# Patient Record
Sex: Male | Born: 1965 | Race: Black or African American | Hispanic: No | Marital: Married | Smoking: Never smoker
Health system: Southern US, Community
[De-identification: ages and names within clinical notes are randomized; demographics above are authoritative.]

## PROBLEM LIST (undated history)

## (undated) DIAGNOSIS — E785 Hyperlipidemia, unspecified: Secondary | ICD-10-CM

## (undated) DIAGNOSIS — Z789 Other specified health status: Secondary | ICD-10-CM

## (undated) DIAGNOSIS — I10 Essential (primary) hypertension: Secondary | ICD-10-CM

## (undated) HISTORY — PX: KNEE SURGERY: SHX244

---

## 2006-01-17 ENCOUNTER — Emergency Department: Payer: Self-pay | Admitting: Emergency Medicine

## 2014-10-22 ENCOUNTER — Encounter: Payer: Self-pay | Admitting: Emergency Medicine

## 2014-10-22 DIAGNOSIS — Y998 Other external cause status: Secondary | ICD-10-CM | POA: Insufficient documentation

## 2014-10-22 DIAGNOSIS — S39012A Strain of muscle, fascia and tendon of lower back, initial encounter: Secondary | ICD-10-CM | POA: Diagnosis not present

## 2014-10-22 DIAGNOSIS — S161XXA Strain of muscle, fascia and tendon at neck level, initial encounter: Secondary | ICD-10-CM | POA: Diagnosis not present

## 2014-10-22 DIAGNOSIS — Y92410 Unspecified street and highway as the place of occurrence of the external cause: Secondary | ICD-10-CM | POA: Diagnosis not present

## 2014-10-22 DIAGNOSIS — Y9389 Activity, other specified: Secondary | ICD-10-CM | POA: Insufficient documentation

## 2014-10-22 DIAGNOSIS — S199XXA Unspecified injury of neck, initial encounter: Secondary | ICD-10-CM | POA: Diagnosis present
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
## 2014-10-23 ENCOUNTER — Emergency Department
Admission: EM | Admit: 2014-10-23 | Discharge: 2014-10-23 | Disposition: A | Discharge status: Home / Self Care | Payer: 59 | Attending: Emergency Medicine | Admitting: Emergency Medicine

## 2014-10-23 ENCOUNTER — Emergency Department: Payer: 59

## 2014-10-23 DIAGNOSIS — S39012A Strain of muscle, fascia and tendon of lower back, initial encounter: Secondary | ICD-10-CM

## 2014-10-23 DIAGNOSIS — S161XXA Strain of muscle, fascia and tendon at neck level, initial encounter: Secondary | ICD-10-CM

## 2014-10-23 MED ORDER — IBUPROFEN 800 MG PO TABS
800.0000 mg | ORAL_TABLET | Freq: Once | ORAL | Status: AC
  Administered 2014-10-23: 800 mg via ORAL
  Filled 2014-10-23: qty 1

## 2014-10-23 MED ORDER — CYCLOBENZAPRINE HCL 10 MG PO TABS
5.0000 mg | ORAL_TABLET | Freq: Once | ORAL | Status: AC
  Administered 2014-10-23: 5 mg via ORAL
  Filled 2014-10-23: qty 1

## 2014-10-23 MED ORDER — IBUPROFEN 800 MG PO TABS: 800.0000 mg | ORAL_TABLET | Freq: Three times a day (TID) | ORAL | Status: DC | PRN

## 2014-10-23 MED ORDER — CYCLOBENZAPRINE HCL 5 MG PO TABS: 5.0000 mg | ORAL_TABLET | Freq: Three times a day (TID) | ORAL | Status: DC | PRN
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
## 2014-10-23 NOTE — Discharge Instructions (Signed)
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 minimize stress and discomfort on your spine, you must sit with correct posture. Sitting with good posture should be effortless for a healthy body. Returning to good posture is a gradual process. Many people can  work toward this most comfortably by using various supports until they have the flexibility and strength to maintain this posture on their own. When sitting with proper posture, your ears will fall over your shoulders and your shoulders will fall over your hips. You should use the back of the chair to support your upper back. Your lower back will be in a neutral position, just slightly arched. You may place a small pillow or folded towel at the base of your lower back for  support.  When working at a desk, create an environment that supports good, upright posture. Without extra support, muscles tire, which leads to excessive strain on joints and other tissues. Keep these recommendations in  mind: CHAIR:  A chair should be able to slide under your desk when your back makes contact with the back of the chair. This allows you to work closely.  The chair's height should allow your eyes to be level with the upper part of your monitor and your hands to be slightly lower than your elbows. BODY POSITION  Your feet should make contact with the floor. If this is not possible, use a foot rest.  Keep your ears over your shoulders. This will reduce stress on your neck and low back. INCORRECT SITTING POSTURES  If you are feeling tired and unable to assume a healthy sitting posture, do not slouch or slump. This puts excessive strain on your back tissues, causing more damage and pain. Healthier options include:  Using more support, like a lumbar pillow.  Switching tasks to something that requires you to be upright or walking.  Talking a brief walk.  Lying down to rest in a neutral-spine position. PROLONGED STANDING WHILE SLIGHTLY LEANING FORWARD  When completing a task that requires you to lean forward while standing in one place for a long time, place either foot up on a stationary 2-4 inch high object to help maintain the best posture. When both feet are on the ground, the lower back tends to lose its slight inward curve. If this curve flattens (or becomes too large), then the back and your other joints will experience too much stress, tire more quickly, and can cause pain. CORRECT STANDING POSTURES Proper standing posture should be assumed with all daily activities, even if they only take a few moments, like when brushing your teeth. As in sitting, your ears should fall over your shoulders and your shoulders should fall over your hips. You should keep a slight tension in your abdominal muscles to brace your spine. Your tailbone should point down to the ground, not behind your body, resulting in an over-extended swayback posture.  INCORRECT STANDING POSTURES  Common incorrect standing  postures include a forward head, locked knees and/or an excessive swayback. WALKING Walk with an upright posture. Your ears, shoulders and hips should all line-up. PROLONGED ACTIVITY IN A FLEXED POSITION When completing a task that requires you to bend forward at your waist or lean over a low surface, try to find a way to stabilize 3 out of 4 of your limbs. You can place a hand or elbow on your thigh or rest a knee on the surface you are reaching across. This will provide you more stability, so that your muscles do not tire as quickly. By keeping your knees relaxed, or slightly bent, you will also reduce stress across your lower back. CORRECT LIFTING TECHNIQUES DO :  Assume a wide stance. This will provide you more stability and the opportunity to get as  close as possible to the object which you are lifting.  Tense your abdominals to brace your spine. Bend at the knees and hips. Keeping your back locked in a neutral-spine position, lift using your leg muscles. Lift with your legs, keeping your back straight.  Test the weight of unknown objects before attempting to lift them.  Try to keep your elbows locked down at your sides in order get the best strength from your shoulders when carrying an object.  Always ask for help when lifting heavy or awkward objects. INCORRECT LIFTING TECHNIQUES DO NOT:   Lock your knees when lifting, even if it is a small object.  Bend and twist. Pivot at your feet or move your feet when needing to change directions.  Assume that you can safely pick up even a paperclip without proper posture.   This information is not intended to replace advice given to you by your health care provider. Make sure you discuss any questions you have with your health care provider.   Document Released: 12/27/2004 Document Revised: 01/17/2014 Document Reviewed: 04/10/2008 Elsevier Interactive Patient Education [employer]
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
## 2014-10-23 NOTE — ED Notes (Signed)
Patient transported to X-ray 

## 2014-10-23 NOTE — ED Notes (Signed)

## 2014-11-29 ENCOUNTER — Encounter: Payer: Self-pay | Admitting: *Deleted

## 2014-11-29 ENCOUNTER — Emergency Department
Admission: EM | Admit: 2014-11-29 | Discharge: 2014-11-29 | Disposition: A | Discharge status: Home / Self Care | Payer: No Typology Code available for payment source | Attending: Emergency Medicine | Admitting: Emergency Medicine

## 2014-11-29 DIAGNOSIS — M7541 Impingement syndrome of right shoulder: Secondary | ICD-10-CM | POA: Insufficient documentation

## 2014-11-29 DIAGNOSIS — M25511 Pain in right shoulder: Secondary | ICD-10-CM | POA: Diagnosis present

## 2014-11-29 MED ORDER — MELOXICAM 15 MG PO TABS: 15.0000 mg | ORAL_TABLET | Freq: Every day | ORAL | Status: DC
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
## 2014-11-29 NOTE — Discharge Instructions (Signed)
Impingement Syndrome, Rotator Cuff, Bursitis With Rehab °Impingement syndrome is a condition that involves inflammation of the tendons of the rotator cuff and the subacromial bursa, that causes pain in the shoulder. The rotator cuff consists of four tendons and muscles that control much of the shoulder and upper arm function. The subacromial bursa is a fluid filled sac that helps reduce friction between the rotator cuff and one of the bones of the shoulder (acromion). Impingement syndrome is usually an overuse injury that causes swelling of the bursa (bursitis), swelling of the tendon (tendonitis), and/or a tear of the tendon (strain). Strains are classified into three categories. Grade 1 strains cause pain, but the tendon is not lengthened. Grade 2 strains include a lengthened ligament, due to the ligament being stretched or partially ruptured. With grade 2 strains there is still function, although the function may be decreased. Grade 3 strains include a complete tear of the tendon or muscle, and function is usually impaired. °SYMPTOMS  °· Pain around the shoulder, often at the outer portion of the upper arm. °· Pain that gets worse with shoulder function, especially when reaching overhead or lifting. °· Sometimes, aching when not using the arm. °· Pain that wakes you up at night. °· Sometimes, tenderness, swelling, warmth, or redness over the affected area. °· Loss of strength. °· Limited motion of the shoulder, especially reaching behind the back (to the back pocket or to unhook bra) or across your body. °· Crackling sound (crepitation) when moving the arm. °· Biceps tendon pain and inflammation (in the front of the shoulder). Worse when bending the elbow or lifting. °CAUSES  °Impingement syndrome is often an overuse injury, in which chronic (repetitive) motions cause the tendons or bursa to become inflamed. A strain occurs when a force is paced on the tendon or muscle that is greater than it can withstand.  Common mechanisms of injury include: °Stress from sudden increase in duration, frequency, or intensity of training. °· Direct hit (trauma) to the shoulder. °· Aging, erosion of the tendon with normal use. °· Bony bump on shoulder (acromial spur). °RISK INCREASES WITH: °· Contact sports (football, wrestling, boxing). °· Throwing sports (baseball, tennis, volleyball). °· Weightlifting and bodybuilding. °· Heavy labor. °· Previous injury to the rotator cuff, including impingement. °· Poor shoulder strength and flexibility. °· Failure to warm up properly before activity. °· Inadequate protective equipment. °· Old age. °· Bony bump on shoulder (acromial spur). °PREVENTION  °· Warm up and stretch properly before activity. °· Allow for adequate recovery between workouts. °· Maintain physical fitness: °¨ Strength, flexibility, and endurance. °¨ Cardiovascular fitness. °· Learn and use proper exercise technique. °PROGNOSIS  °If treated properly, impingement syndrome usually goes away within 6 weeks. Sometimes surgery is required.  °RELATED COMPLICATIONS  °· Longer healing time if not properly treated, or if not given enough time to heal. °· Recurring symptoms, that result in a chronic condition. °· Shoulder stiffness, frozen shoulder, or loss of motion. °· Rotator cuff tendon tear. °· Recurring symptoms, especially if activity is resumed too soon, with overuse, with a direct blow, or when using poor technique. °TREATMENT  °Treatment first involves the use of ice and medicine, to reduce pain and inflammation. The use of strengthening and stretching exercises may help reduce pain with activity. These exercises may be performed at home or with a therapist. If non-surgical treatment is unsuccessful after more than 6 months, surgery may be advised. After surgery and rehabilitation, activity is usually possible in 3 months.  °  MEDICATION °· If pain medicine is needed, nonsteroidal anti-inflammatory medicines (aspirin and  ibuprofen), or other minor pain relievers (acetaminophen), are often advised. °· Do not take pain medicine for 7 days before surgery. °· Prescription pain relievers may be given, if your caregiver thinks they are needed. Use only as directed and only as much as you need. °· Corticosteroid injections may be given by your caregiver. These injections should be reserved for the most serious cases, because they may only be given a certain number of times. °HEAT AND COLD °· Cold treatment (icing) should be applied for 10 to 15 minutes every 2 to 3 hours for inflammation and pain, and immediately after activity that aggravates your symptoms. Use ice packs or an ice massage. °· Heat treatment may be used before performing stretching and strengthening activities prescribed by your caregiver, physical therapist, or athletic trainer. Use a heat pack or a warm water soak. °SEEK MEDICAL CARE IF:  °· Symptoms get worse or do not improve in 4 to 6 weeks, despite treatment. °· New, unexplained symptoms develop. (Drugs used in treatment may produce side effects.) ° °
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
## 2014-12-17 ENCOUNTER — Other Ambulatory Visit: Payer: Self-pay | Admitting: Orthopedic Surgery

## 2014-12-17 DIAGNOSIS — M25511 Pain in right shoulder: Secondary | ICD-10-CM

## 2015-01-08 ENCOUNTER — Ambulatory Visit
Admission: RE | Admit: 2015-01-08 | Discharge: 2015-01-08 | Disposition: A | Discharge status: Home / Self Care | Payer: 59 | Source: Ambulatory Visit | Attending: Orthopedic Surgery | Admitting: Orthopedic Surgery

## 2015-01-08 DIAGNOSIS — M7551 Bursitis of right shoulder: Secondary | ICD-10-CM | POA: Insufficient documentation

## 2015-01-08 DIAGNOSIS — S46021A Laceration of muscle(s) and tendon(s) of the rotator cuff of right shoulder, initial encounter: Secondary | ICD-10-CM | POA: Insufficient documentation

## 2015-01-08 DIAGNOSIS — M25511 Pain in right shoulder: Secondary | ICD-10-CM | POA: Diagnosis present

## 2015-01-08 MED ORDER — GADOBENATE DIMEGLUMINE 529 MG/ML IV SOLN
0.1000 mL | Freq: Once | INTRAVENOUS | Status: AC | PRN
  Administered 2015-01-08: 0.1 mL via INTRA_ARTICULAR

## 2015-01-08 MED ORDER — IOHEXOL 300 MG/ML  SOLN
10.0000 mL | Freq: Once | INTRAMUSCULAR | Status: AC | PRN
  Administered 2015-01-08: 10 mL via INTRA_ARTICULAR

## 2015-07-31 ENCOUNTER — Emergency Department: Payer: Worker's Compensation

## 2015-07-31 ENCOUNTER — Emergency Department
Admission: EM | Admit: 2015-07-31 | Discharge: 2015-07-31 | Disposition: A | Discharge status: Home / Self Care | Payer: Worker's Compensation | Attending: Emergency Medicine | Admitting: Emergency Medicine

## 2015-07-31 ENCOUNTER — Encounter: Payer: Self-pay | Admitting: Medical Oncology

## 2015-07-31 DIAGNOSIS — Y999 Unspecified external cause status: Secondary | ICD-10-CM | POA: Diagnosis not present

## 2015-07-31 DIAGNOSIS — R51 Headache: Secondary | ICD-10-CM | POA: Diagnosis not present

## 2015-07-31 DIAGNOSIS — S40012A Contusion of left shoulder, initial encounter: Secondary | ICD-10-CM | POA: Diagnosis not present

## 2015-07-31 DIAGNOSIS — Y9389 Activity, other specified: Secondary | ICD-10-CM | POA: Diagnosis not present

## 2015-07-31 DIAGNOSIS — M542 Cervicalgia: Secondary | ICD-10-CM | POA: Diagnosis present

## 2015-07-31 DIAGNOSIS — Y92410 Unspecified street and highway as the place of occurrence of the external cause: Secondary | ICD-10-CM | POA: Insufficient documentation

## 2015-07-31 DIAGNOSIS — M62838 Other muscle spasm: Secondary | ICD-10-CM | POA: Diagnosis not present

## 2015-07-31 MED ORDER — MELOXICAM 15 MG PO TABS: 15.0000 mg | ORAL_TABLET | Freq: Every day | ORAL | Status: DC

## 2015-07-31 MED ORDER — METHOCARBAMOL 500 MG PO TABS: 500.0000 mg | ORAL_TABLET | Freq: Four times a day (QID) | ORAL | Status: DC

## 2015-07-31 NOTE — ED Notes (Signed)
  Reviewed d/c instructions, follow-up care, and prescriptions with pt. Pt verbalized understanding 
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
## 2015-10-01 ENCOUNTER — Other Ambulatory Visit: Payer: Self-pay

## 2015-10-27 ENCOUNTER — Other Ambulatory Visit: Payer: Self-pay

## 2015-11-03 ENCOUNTER — Ambulatory Visit: Admit: 2015-11-03 | Payer: No Typology Code available for payment source | Admitting: Orthopedic Surgery

## 2015-11-03 SURGERY — ARTHROSCOPY, SHOULDER WITH REPAIR, ROTATOR CUFF, OPEN
Anesthesia: Choice | Laterality: Right

## 2016-10-28 ENCOUNTER — Other Ambulatory Visit: Payer: Self-pay | Admitting: Orthopedic Surgery

## 2016-11-01 ENCOUNTER — Encounter
Admission: RE | Admit: 2016-11-01 | Discharge: 2016-11-01 | Disposition: A | Discharge status: Home / Self Care | Payer: No Typology Code available for payment source | Source: Ambulatory Visit | Attending: Orthopedic Surgery | Admitting: Orthopedic Surgery

## 2016-11-01 HISTORY — DX: Other specified health status: Z78.9

## 2016-11-01 NOTE — Patient Instructions (Signed)
Your procedure is scheduled on: 11-08-16 TUESDAY Report to Same Day Surgery 2nd floor medical mall [facility](Medical Mall Entrance-take elevator on left to 2nd floor.  Check in with surgery information desk.) To find out your arrival time please call [phone] between 1PM - 3PM on 11-07-16 MONDAY  Remember: Instructions that are not followed completely may result in serious medical risk, up to and including death, or upon the discretion of your surgeon and anesthesiologist your surgery may need to be rescheduled.    _x___ 1. Do not eat food after midnight the night before your procedure. NO GUM CHEWING OR HARD CANDIES.  You may drink clear liquids up to 2 hours before you are scheduled to arrive at the hospital for your procedure.  Do not drink clear liquids within 2 hours of your scheduled arrival to the hospital.  Clear liquids include  --Water or Apple juice without pulp  --Clear carbohydrate beverage such as ClearFast or Gatorade  --Black Coffee or Clear Tea (No milk, no creamers, do not add anything to the coffee or Tea)  Type 1 and type 2 diabetics should only drink water.     __x__ 2. No Alcohol for 24 hours before or after surgery.   __x__3. No Smoking for 24 prior to surgery.   ____  4. Bring all medications with you on the day of surgery if instructed.    __x__ 5. Notify your doctor if there is any change in your medical condition     (cold, fever, infections).     Do not wear jewelry, make-up, hairpins, clips or nail polish.  Do not wear lotions, powders, or perfumes. You may wear deodorant.  Do not shave 48 hours prior to surgery. Men may shave face and neck.  Do not bring valuables to the hospital.    [facility] not responsible for any belongings or valuables.               Contacts, dentures or bridgework may not be worn into surgery.  Leave your suitcase in the car. After surgery it may be brought to your room.  For patients admitted to the hospital, discharge time  is determined by your                       treatment team.   Patients discharged the day of surgery will not be allowed to drive home.  You will need someone to drive you home and stay with you the night of your procedure.    Please read over the following fact sheets that you were given:   [facility] Preparing for [facility] and or MRSA Information   ____ Take anti-hypertensive listed below, cardiac, seizure, asthma, anti-reflux and psychiatric medicines. These include:  1. NONE  2.  3.  4.  5.  6.  ____Fleets enema or Magnesium Citrate as directed.   _x___ Use CHG Soap or sage wipes as directed on instruction sheet   ____ Use inhalers on the day of surgery and bring to hospital day of surgery  ____ Stop Metformin and Janumet 2 days prior to surgery.    ____ Take 1/2 of usual insulin dose the night before surgery and none on the morning surgery.   ____ Follow recommendations from Cardiologist, Pulmonologist or PCP regarding stopping Aspirin, Coumadin, Plavix ,Eliquis, Effient, or Pradaxa, and Pletal.  X____Stop Anti-inflammatories such as Advil, Aleve, IBUPROFEN, Motrin, Naproxen, MELOXICAM, Naprosyn, Goodies powders or aspirin products NOW-OK to take Tylenol  ____ Stop supplements until after surgery.     ____ Bring C-Pap to the hospital.

## 2016-11-02 ENCOUNTER — Inpatient Hospital Stay: Admission: RE | Admit: 2016-11-02 | Payer: Self-pay | Source: Ambulatory Visit

## 2016-11-03 ENCOUNTER — Encounter
Admission: RE | Admit: 2016-11-03 | Discharge: 2016-11-03 | Disposition: A | Discharge status: Home / Self Care | Payer: 59 | Source: Ambulatory Visit | Attending: Orthopedic Surgery | Admitting: Orthopedic Surgery

## 2016-11-03 DIAGNOSIS — M75112 Incomplete rotator cuff tear or rupture of left shoulder, not specified as traumatic: Secondary | ICD-10-CM | POA: Insufficient documentation

## 2016-11-03 DIAGNOSIS — M7552 Bursitis of left shoulder: Secondary | ICD-10-CM | POA: Insufficient documentation

## 2016-11-03 DIAGNOSIS — Z882 Allergy status to sulfonamides status: Secondary | ICD-10-CM | POA: Insufficient documentation

## 2016-11-03 DIAGNOSIS — M19012 Primary osteoarthritis, left shoulder: Secondary | ICD-10-CM | POA: Insufficient documentation

## 2016-11-03 DIAGNOSIS — M7542 Impingement syndrome of left shoulder: Secondary | ICD-10-CM | POA: Insufficient documentation

## 2016-11-03 DIAGNOSIS — Z9889 Other specified postprocedural states: Secondary | ICD-10-CM | POA: Insufficient documentation

## 2016-11-03 DIAGNOSIS — Z01812 Encounter for preprocedural laboratory examination: Secondary | ICD-10-CM | POA: Insufficient documentation

## 2016-11-03 DIAGNOSIS — Z8249 Family history of ischemic heart disease and other diseases of the circulatory system: Secondary | ICD-10-CM | POA: Insufficient documentation

## 2016-11-03 DIAGNOSIS — Z881 Allergy status to other antibiotic agents status: Secondary | ICD-10-CM | POA: Insufficient documentation

## 2016-11-03 LAB — CBC WITH DIFFERENTIAL/PLATELET
BASOS ABS: 0 10*3/uL (ref 0–0.1)
BASOS PCT: 0 %
Eosinophils Absolute: 0.1 10*3/uL (ref 0–0.7)
Eosinophils Relative: 2 %
HEMATOCRIT: 40.6 % (ref 40.0–52.0)
HEMOGLOBIN: 13.2 g/dL (ref 13.0–18.0)
Lymphocytes Relative: 42 %
Lymphs Abs: 2.2 10*3/uL (ref 1.0–3.6)
MCH: 28.4 pg (ref 26.0–34.0)
MCHC: 32.5 g/dL (ref 32.0–36.0)
MCV: 87.3 fL (ref 80.0–100.0)
MONOS PCT: 8 %
Monocytes Absolute: 0.4 10*3/uL (ref 0.2–1.0)
NEUTROS ABS: 2.5 10*3/uL (ref 1.4–6.5)
NEUTROS PCT: 48 %
Platelets: 305 10*3/uL (ref 150–440)
RBC: 4.65 MIL/uL (ref 4.40–5.90)
RDW: 14.2 % (ref 11.5–14.5)
WBC: 5.3 10*3/uL (ref 3.8–10.6)

## 2016-11-03 LAB — APTT: aPTT: 30 seconds (ref 24–36)

## 2016-11-03 LAB — PROTIME-INR
INR: 0.98
PROTHROMBIN TIME: 12.9 s (ref 11.4–15.2)

## 2016-11-07 MED ORDER — CEFAZOLIN SODIUM-DEXTROSE 2-4 GM/100ML-% IV SOLN
2.0000 g | INTRAVENOUS | Status: AC
  Administered 2016-11-08: 2 g via INTRAVENOUS

## 2016-11-08 ENCOUNTER — Encounter: Payer: Self-pay | Admitting: *Deleted

## 2016-11-08 ENCOUNTER — Ambulatory Visit: Payer: Worker's Compensation | Admitting: Anesthesiology

## 2016-11-08 ENCOUNTER — Encounter
Admission: RE | Disposition: A | Discharge status: Home / Self Care | Payer: Self-pay | Source: Ambulatory Visit | Attending: Orthopedic Surgery

## 2016-11-08 ENCOUNTER — Ambulatory Visit
Admission: RE | Admit: 2016-11-08 | Discharge: 2016-11-08 | Disposition: A | Discharge status: Home / Self Care | Payer: Worker's Compensation | Source: Ambulatory Visit | Attending: Orthopedic Surgery | Admitting: Orthopedic Surgery

## 2016-11-08 DIAGNOSIS — Z882 Allergy status to sulfonamides status: Secondary | ICD-10-CM | POA: Insufficient documentation

## 2016-11-08 DIAGNOSIS — M7542 Impingement syndrome of left shoulder: Secondary | ICD-10-CM | POA: Diagnosis not present

## 2016-11-08 DIAGNOSIS — M7502 Adhesive capsulitis of left shoulder: Secondary | ICD-10-CM | POA: Insufficient documentation

## 2016-11-08 DIAGNOSIS — M75112 Incomplete rotator cuff tear or rupture of left shoulder, not specified as traumatic: Secondary | ICD-10-CM | POA: Diagnosis not present

## 2016-11-08 DIAGNOSIS — M25819 Other specified joint disorders, unspecified shoulder: Secondary | ICD-10-CM | POA: Diagnosis present

## 2016-11-08 HISTORY — PX: SHOULDER ARTHROSCOPY WITH OPEN ROTATOR CUFF REPAIR AND DISTAL CLAVICLE ACROMINECTOMY: SHX5683

## 2016-11-08 SURGERY — SHOULDER ARTHROSCOPY WITH OPEN ROTATOR CUFF REPAIR AND DISTAL CLAVICLE ACROMINECTOMY
Anesthesia: Regional | Site: Shoulder | Laterality: Left | Wound class: Clean

## 2016-11-08 MED ORDER — CEFAZOLIN SODIUM-DEXTROSE 2-4 GM/100ML-% IV SOLN
INTRAVENOUS | Status: AC
  Filled 2016-11-08: qty 100

## 2016-11-08 MED ORDER — FENTANYL CITRATE (PF) 100 MCG/2ML IJ SOLN
INTRAMUSCULAR | Status: AC
  Filled 2016-11-08: qty 2

## 2016-11-08 MED ORDER — DEXAMETHASONE SODIUM PHOSPHATE 10 MG/ML IJ SOLN
INTRAMUSCULAR | Status: AC
  Filled 2016-11-08: qty 1

## 2016-11-08 MED ORDER — PROPOFOL 10 MG/ML IV BOLUS
INTRAVENOUS | Status: DC | PRN
  Administered 2016-11-08: 200 mg via INTRAVENOUS

## 2016-11-08 MED ORDER — MIDAZOLAM HCL 2 MG/2ML IJ SOLN
INTRAMUSCULAR | Status: AC
  Administered 2016-11-08: 1 mg via INTRAVENOUS
  Filled 2016-11-08: qty 2

## 2016-11-08 MED ORDER — GLYCOPYRROLATE 0.2 MG/ML IJ SOLN
INTRAMUSCULAR | Status: AC
Start: 2016-11-08 — End: 2016-11-08
  Filled 2016-11-08: qty 2

## 2016-11-08 MED ORDER — LIDOCAINE HCL (PF) 1 % IJ SOLN
INTRAMUSCULAR | Status: DC | PRN
  Administered 2016-11-08: 5 mL via SUBCUTANEOUS

## 2016-11-08 MED ORDER — FENTANYL CITRATE (PF) 100 MCG/2ML IJ SOLN
INTRAMUSCULAR | Status: AC
  Administered 2016-11-08: 50 ug via INTRAVENOUS
  Filled 2016-11-08: qty 2

## 2016-11-08 MED ORDER — ONDANSETRON HCL 4 MG/2ML IJ SOLN
INTRAMUSCULAR | Status: DC | PRN
  Administered 2016-11-08: 4 mg via INTRAVENOUS

## 2016-11-08 MED ORDER — ROCURONIUM BROMIDE 100 MG/10ML IV SOLN
INTRAVENOUS | Status: DC | PRN
  Administered 2016-11-08: 40 mg via INTRAVENOUS

## 2016-11-08 MED ORDER — PROMETHAZINE HCL 25 MG/ML IJ SOLN
INTRAMUSCULAR | Status: AC
  Filled 2016-11-08: qty 1

## 2016-11-08 MED ORDER — BUPIVACAINE HCL 0.25 % IJ SOLN
INTRAMUSCULAR | Status: DC | PRN
  Administered 2016-11-08: 30 mL

## 2016-11-08 MED ORDER — LIDOCAINE HCL (CARDIAC) 20 MG/ML IV SOLN
INTRAVENOUS | Status: DC | PRN
Start: 2016-11-08 — End: 2016-11-08
  Administered 2016-11-08: 100 mg via INTRAVENOUS

## 2016-11-08 MED ORDER — FENTANYL CITRATE (PF) 100 MCG/2ML IJ SOLN: 25.0000 ug | INTRAMUSCULAR | Status: DC | PRN

## 2016-11-08 MED ORDER — NEOMYCIN-POLYMYXIN B GU 40-200000 IR SOLN
Status: DC | PRN
  Administered 2016-11-08: 2 mL

## 2016-11-08 MED ORDER — FAMOTIDINE 20 MG PO TABS
ORAL_TABLET | ORAL | Status: AC
  Administered 2016-11-08: 20 mg via ORAL
  Filled 2016-11-08: qty 1

## 2016-11-08 MED ORDER — PROPOFOL 10 MG/ML IV BOLUS
INTRAVENOUS | Status: AC
  Filled 2016-11-08: qty 40

## 2016-11-08 MED ORDER — SUCCINYLCHOLINE CHLORIDE 20 MG/ML IJ SOLN
INTRAMUSCULAR | Status: AC
  Filled 2016-11-08: qty 1

## 2016-11-08 MED ORDER — ONDANSETRON HCL 4 MG/2ML IJ SOLN
INTRAMUSCULAR | Status: AC
  Filled 2016-11-08: qty 4

## 2016-11-08 MED ORDER — SODIUM CHLORIDE 0.9 % IJ SOLN
INTRAMUSCULAR | Status: AC
  Filled 2016-11-08: qty 10

## 2016-11-08 MED ORDER — CHLORHEXIDINE GLUCONATE CLOTH 2 % EX PADS: 6.0000 | MEDICATED_PAD | Freq: Once | CUTANEOUS | Status: DC

## 2016-11-08 MED ORDER — LIDOCAINE HCL (PF) 1 % IJ SOLN
INTRAMUSCULAR | Status: AC
  Filled 2016-11-08: qty 5

## 2016-11-08 MED ORDER — EPINEPHRINE 30 MG/30ML IJ SOLN
INTRAMUSCULAR | Status: AC
  Filled 2016-11-08: qty 1

## 2016-11-08 MED ORDER — PROPOFOL 10 MG/ML IV BOLUS
INTRAVENOUS | Status: AC
  Filled 2016-11-08: qty 20

## 2016-11-08 MED ORDER — MIDAZOLAM HCL 2 MG/2ML IJ SOLN
INTRAMUSCULAR | Status: AC
  Filled 2016-11-08: qty 2

## 2016-11-08 MED ORDER — OXYCODONE HCL 5 MG PO TABS: 5.0000 mg | ORAL_TABLET | Freq: Four times a day (QID) | ORAL | 0 refills | Status: DC | PRN

## 2016-11-08 MED ORDER — ROPIVACAINE HCL 5 MG/ML IJ SOLN
INTRAMUSCULAR | Status: DC | PRN
  Administered 2016-11-08: 30 mL via PERINEURAL

## 2016-11-08 MED ORDER — MIDAZOLAM HCL 2 MG/2ML IJ SOLN
1.0000 mg | Freq: Once | INTRAMUSCULAR | Status: AC
  Administered 2016-11-08: 1 mg via INTRAVENOUS

## 2016-11-08 MED ORDER — LACTATED RINGERS IV SOLN
INTRAVENOUS | Status: DC
  Administered 2016-11-08: 07:00:00 via INTRAVENOUS

## 2016-11-08 MED ORDER — PROMETHAZINE HCL 25 MG/ML IJ SOLN
6.2500 mg | INTRAMUSCULAR | Status: DC | PRN
  Administered 2016-11-08: 6.25 mg via INTRAVENOUS

## 2016-11-08 MED ORDER — ONDANSETRON HCL 4 MG PO TABS: 4.0000 mg | ORAL_TABLET | Freq: Three times a day (TID) | ORAL | 0 refills | Status: DC | PRN

## 2016-11-08 MED ORDER — ROCURONIUM BROMIDE 50 MG/5ML IV SOLN
INTRAVENOUS | Status: AC
  Filled 2016-11-08: qty 1

## 2016-11-08 MED ORDER — FENTANYL CITRATE (PF) 100 MCG/2ML IJ SOLN
50.0000 ug | Freq: Once | INTRAMUSCULAR | Status: AC
  Administered 2016-11-08: 50 ug via INTRAVENOUS

## 2016-11-08 MED ORDER — ROPIVACAINE HCL 5 MG/ML IJ SOLN
INTRAMUSCULAR | Status: AC
  Filled 2016-11-08: qty 30

## 2016-11-08 MED ORDER — FAMOTIDINE 20 MG PO TABS
20.0000 mg | ORAL_TABLET | Freq: Once | ORAL | Status: AC
  Administered 2016-11-08: 20 mg via ORAL

## 2016-11-08 MED ORDER — EPINEPHRINE PF 1 MG/ML IJ SOLN
INTRAMUSCULAR | Status: DC | PRN
  Administered 2016-11-08: 10 mL

## 2016-11-08 MED ORDER — GLYCOPYRROLATE 0.2 MG/ML IJ SOLN
INTRAMUSCULAR | Status: DC | PRN
  Administered 2016-11-08: 0.6 mg via INTRAVENOUS

## 2016-11-08 MED ORDER — NEOSTIGMINE METHYLSULFATE 10 MG/10ML IV SOLN
INTRAVENOUS | Status: DC | PRN
  Administered 2016-11-08: 5 mg via INTRAVENOUS

## 2016-11-08 MED ORDER — DEXAMETHASONE SODIUM PHOSPHATE 10 MG/ML IJ SOLN
INTRAMUSCULAR | Status: DC | PRN
Start: 2016-11-08 — End: 2016-11-08
  Administered 2016-11-08: 10 mg via INTRAVENOUS

## 2016-11-08 MED ORDER — BUPIVACAINE HCL (PF) 0.25 % IJ SOLN
INTRAMUSCULAR | Status: AC
  Filled 2016-11-08: qty 30

## 2016-11-08 MED ORDER — NEOMYCIN-POLYMYXIN B GU 40-200000 IR SOLN
Status: AC
  Filled 2016-11-08: qty 2

## 2016-11-08 SURGICAL SUPPLY — 69 items
ADAPTER IRRIG TUBE 2 SPIKE SOL (ADAPTER) ×6 IMPLANT
ANCHOR SUT 5.5 MULTIFIX (Orthopedic Implant) ×2 IMPLANT
ANCHOR SUT 5.5MM MULTIFIX (Orthopedic Implant) ×1 IMPLANT
BUR RADIUS 4.0X18.5 (BURR) ×3 IMPLANT
BUR RADIUS 5.5 (BURR) ×3 IMPLANT
CANISTER SUCT LVC 12 LTR MEDI- (MISCELLANEOUS) IMPLANT
CANNULA 5.75X7 CRYSTAL CLEAR (CANNULA) ×6 IMPLANT
CANNULA PARTIAL THREAD 2X7 (CANNULA) ×3 IMPLANT
CANNULA TWIST IN 8.25X9CM (CANNULA) ×6 IMPLANT
CLOSURE WOUND 1/2 X4 (GAUZE/BANDAGES/DRESSINGS) ×1
CONNECTOR PERFECT PASSER (CONNECTOR) ×3 IMPLANT
COOLER POLAR GLACIER W/PUMP (MISCELLANEOUS) ×3 IMPLANT
CRADLE LAMINECT ARM (MISCELLANEOUS) ×6 IMPLANT
DEVICE SUCT BLK HOLE OR FLOOR (MISCELLANEOUS) ×6 IMPLANT
DRAPE IMP U-DRAPE 54X76 (DRAPES) ×6 IMPLANT
DRAPE INCISE IOBAN 66X45 STRL (DRAPES) ×3 IMPLANT
DRAPE SHEET LG 3/4 BI-LAMINATE (DRAPES) ×3 IMPLANT
DRAPE U-SHAPE 47X51 STRL (DRAPES) ×3 IMPLANT
DURAPREP 26ML APPLICATOR (WOUND CARE) ×9 IMPLANT
ELECT REM PT RETURN 9FT ADLT (ELECTROSURGICAL) ×3
ELECTRODE REM PT RTRN 9FT ADLT (ELECTROSURGICAL) ×1 IMPLANT
GAUZE PETRO XEROFOAM 1X8 (MISCELLANEOUS) ×3 IMPLANT
GAUZE SPONGE 4X4 12PLY STRL (GAUZE/BANDAGES/DRESSINGS) ×3 IMPLANT
GLOVE BIOGEL PI IND STRL 9 (GLOVE) ×1 IMPLANT
GLOVE BIOGEL PI INDICATOR 9 (GLOVE) ×2
GLOVE SURG 9.0 ORTHO LTXF (GLOVE) ×9 IMPLANT
GOWN STRL REUS TWL 2XL XL LVL4 (GOWN DISPOSABLE) ×3 IMPLANT
GOWN STRL REUS W/ TWL LRG LVL3 (GOWN DISPOSABLE) ×1 IMPLANT
GOWN STRL REUS W/TWL LRG LVL3 (GOWN DISPOSABLE) ×2
IV LACTATED RINGER IRRG 3000ML (IV SOLUTION) ×20
IV LR IRRIG 3000ML ARTHROMATIC (IV SOLUTION) ×10 IMPLANT
KIT RM TURNOVER STRD PROC AR (KITS) ×3 IMPLANT
KIT STABILIZATION SHOULDER (MISCELLANEOUS) ×3 IMPLANT
KIT SUTURE 2.8 Q-FIX DISP (MISCELLANEOUS) ×3 IMPLANT
KIT SUTURETAK 3.0 INSERT PERC (KITS) IMPLANT
MANIFOLD NEPTUNE II (INSTRUMENTS) ×3 IMPLANT
MASK FACE SPIDER DISP (MASK) ×3 IMPLANT
MAT BLUE FLOOR 46X72 FLO (MISCELLANEOUS) ×6 IMPLANT
NDL SAFETY 18GX1.5 (NEEDLE) ×3 IMPLANT
NDL SAFETY 22GX1.5 (NEEDLE) ×3 IMPLANT
NS IRRIG 500ML POUR BTL (IV SOLUTION) ×3 IMPLANT
PACK ARTHROSCOPY SHOULDER (MISCELLANEOUS) ×3 IMPLANT
PAD WRAPON POLAR SHDR XLG (MISCELLANEOUS) ×1 IMPLANT
PASSER SUT CAPTURE FIRST (SUTURE) ×3 IMPLANT
SET TUBE SUCT SHAVER OUTFL 24K (TUBING) ×3 IMPLANT
SET TUBE TIP INTRA-ARTICULAR (MISCELLANEOUS) ×3 IMPLANT
STRIP CLOSURE SKIN 1/2X4 (GAUZE/BANDAGES/DRESSINGS) ×2 IMPLANT
SUT ETHILON 4-0 (SUTURE) ×2
SUT ETHILON 4-0 FS2 18XMFL BLK (SUTURE) ×1
SUT LASSO 90 DEG SD STR (SUTURE) IMPLANT
SUT MNCRL 4-0 (SUTURE)
SUT MNCRL 4-0 27XMFL (SUTURE)
SUT PDS AB 0 CT1 27 (SUTURE) ×3 IMPLANT
SUT PERFECTPASSER WHITE CART (SUTURE) ×3 IMPLANT
SUT SMART STITCH CARTRIDGE (SUTURE) ×3 IMPLANT
SUT VIC AB 0 CT1 36 (SUTURE) IMPLANT
SUT VIC AB 2-0 CT2 27 (SUTURE) IMPLANT
SUTURE ETHLN 4-0 FS2 18XMF BLK (SUTURE) ×1 IMPLANT
SUTURE MAGNUM WIRE 2X48 BLK (SUTURE) IMPLANT
SUTURE MNCRL 4-0 27XMF (SUTURE) IMPLANT
SYR 10ML 18GX1 1/2 (NEEDLE) ×3 IMPLANT
SYR 5ML 18GX1 1/2 (NEEDLE) ×3 IMPLANT
SYRINGE 10CC LL (SYRINGE) ×3 IMPLANT
TAPE MICROFOAM 4IN (TAPE) ×3 IMPLANT
TUBING ARTHRO INFLOW-ONLY STRL (TUBING) ×3 IMPLANT
TUBING CONNECTING 10 (TUBING) ×2 IMPLANT
TUBING CONNECTING 10' (TUBING) ×1
WAND HAND CNTRL MULTIVAC 90 (MISCELLANEOUS) ×3 IMPLANT
WRAPON POLAR PAD SHDR XLG (MISCELLANEOUS) ×3

## 2016-11-08 NOTE — Anesthesia Postprocedure Evaluation (Signed)
Anesthesia Post Note  Patient: Carolin Sicksony C Morreale  Procedure(s) Performed: SHOULDER ARTHROSCOPY, SUBACROMINAL DECOMPRESSION, ROTATOR CUFF REPAIR, LYSIS OF ADHESION (Left Shoulder)  Patient location during evaluation: PACU Anesthesia Type: Regional and General Level of consciousness: awake and alert Pain management: pain level controlled Vital Signs Assessment: post-procedure vital signs reviewed and stable Respiratory status: spontaneous breathing, nonlabored ventilation, respiratory function stable and patient connected to nasal cannula oxygen Cardiovascular status: blood pressure returned to baseline and stable Postop Assessment: no apparent nausea or vomiting Anesthetic complications: no     Last Vitals:  Vitals:   11/08/16 1101 11/08/16 1140  BP: (!) 173/88 (!) 183/93  Pulse: 99 71  Resp: 16 16  Temp: (!) 36.2 C (!) 36.1 C  SpO2: 100% 97%    Last Pain:  Vitals:   11/08/16 1140  TempSrc: Tympanic                 Lenard SimmerAndrew Aryella Besecker

## 2016-11-08 NOTE — Op Note (Signed)
11/08/2016  10:26 AM  PATIENT:  James Mosley  51 y.o. male  PRE-OPERATIVE DIAGNOSIS: Partial tear of left infraspinatus tendon with shoulder impingement  POST-OPERATIVE DIAGNOSIS:  Partial tear of left infraspinatus tendon with shoulder impingement and anterior capsular thickening/adhesive capsulitis  PROCEDURE:  Procedure(s): LEFT SHOULDER ARTHROSCOPY, SUBACROMINAL DECOMPRESSION, ROTATOR CUFF REPAIR, LYSIS OF ADHESIONS  SURGEON:  Surgeon(s) and Role:    Thornton Park, MD - Primary  ANESTHESIA:   local, general and paracervical block   PREOPERATIVE INDICATIONS:  [name] is a  51 y.o. male with a diagnosis of partial tear of left infraspinatus tendon with shoulder impingement and anterior capsular thickening/adhesive capsulitis who failed conservative treatment and elected for surgical management.    The risks benefits and alternatives were discussed with the patient preoperatively including but not limited to the risks of infection, bleeding, nerve injury, persistent pain or weakness, failure of the hardware, re-tear of the rotator cuff and the need for further surgery. Medical risks include DVT and pulmonary embolism, myocardial infarction, stroke, pneumonia, respiratory failure and death. Patient understood these risks and wished to proceed.  OPERATIVE IMPLANTS: [employer] Multifix x 1  OPERATIVE FINDINGS: High grade partial thickness tear of the infraspinatus tendon, anterior capsular thickening, subacromial spurring and bursitis  OPERATIVE PROCEDURE: The patient was met in the preoperative area. The left shoulder was signed with the word yes and my initials according the hospital's correct site of surgery protocol.   History and physical was updated.   Patient underwent an interscalene block by the anesthesia service.  Patient was brought to the operating room where he underwent general anesthesia.  The patient was placed in a beachchair position.  A spider arm  positioner was used for this case. Examination under anesthesia revealed no limitation of motion or instability with load shift testing. The patient had a negative sulcus sign.  Patient was prepped and draped in a sterile fashion. A timeout was performed to verify the patient's name, date of birth, medical record number, correct site of surgery and correct procedure to be performed there was also used to verify the patient received antibiotics that all appropriate instruments, implants and radiographs studies were available in the room. Once all in attendance were in agreement case began.  Bony landmarks were drawn out with a surgical marker along with proposed arthroscopy incisions.  An 11 blade was used to establish a posterior portal through which the arthroscope was placed in the glenohumeral joint. A full diagnostic examination of the shoulder was performed.  An 18-gauge spinal needle was used to place a 0 PDS suture through the supraspinatus for identification from the bursal side.  There was anterior capsular thickening which was debrided in the area of the rotator interval using a 90 ArthroCare wand. Patient had superior labral fraying which was debrided with a 4.0 resector shaver blade. There were no focal chondral lesions. There were no loose bodies seen. Patient's biceps and subscapularis tendons was uninjured.  There was no definite rotator cuff tear identified from the articular side.  The arthroscope was then placed in the subacromial space. The 0 PDS suture was identified.  Bursitis was encountered and debrided using a 4-0 resector shaver blade and a 90 ArthroCare wand from a lateral portal which was established under direct visualization using an 18-gauge spinal needle. A subacromial decompression was also performed using a 5.5 mm resector shaver blade from the lateral portal. The high-grade partial-thickness tear involving the infraspinatus was identified posterior to  the 0 PDS suture.    This was debrided to healthy tissue  with a 4.0 resector shaver blade.  Two Perfect Pass suture were placed in the lateral border of the rotator cuff tear.  The greater tuberosity just beneath the tear was debrided with a 5.5 mm resector shaver blade until punctate bleeding was identified.  Through the lateral portal a starting punch for the multi-fix suture anchor was inserted through the cortical bone anterior lateral to the infraspinatus tear. The perfect Pass suture limbs were then passed through the multi-fix anchor. This was advanced through the lateral portal and malleted into position and the hole previously created by the punch. The sutures were then tensioned. The multi-fix anchor was then screwed in by hand until firm fixation was achieved.  An arthroscopic suture cutter was used for the suture tails. Final arthroscopic images of the repair were taken from both the bursal side and from the glenohumeral joint. Patient had excellent coverage of the greater tuberosity footprint. There was excellent tension on the patient's suture repair.  All arthroscopic incisions were removed.  Skin closure for the arthroscopic incisions was performed with 4-0 nylon. The skin edges of the saber incision was approximated with a running 4-0 undyed Monocryl.  0.25% Marcaine plain was then injected into the subacromial space for postoperative pain control. A dry sterile dressing was applied.  The patient was placed in an abduction sling and a Polar Care was applied to the shoulder.  All sharp and it instrument counts were correct at the conclusion of the case. I was scrubbed and present for the entire case. I spoke with the patient's sister postoperatively by phone to let her know the case had gone without complication and the patient was stable in recovery room.

## 2016-11-08 NOTE — Anesthesia Preprocedure Evaluation (Signed)
Anesthesia Evaluation  Patient identified by MRN, date of birth, ID band Patient awake    Reviewed: Allergy & Precautions, H&P , NPO status , Patient's Chart, lab work & pertinent test results, reviewed documented beta blocker date and time   History of Anesthesia Complications Negative for: history of anesthetic complications  Airway Mallampati: II  TM Distance: >3 FB Neck ROM: full    Dental  (+) Dental Advidsory Given, Missing, Teeth Intact   Pulmonary neg pulmonary ROS,           Cardiovascular Exercise Tolerance: Good negative cardio ROS       Neuro/Psych negative neurological ROS  negative psych ROS   GI/Hepatic negative GI ROS, Neg liver ROS,   Endo/Other  negative endocrine ROS  Renal/GU negative Renal ROS  negative genitourinary   Musculoskeletal   Abdominal   Peds  Hematology negative hematology ROS (+)   Anesthesia Other Findings Past Medical History: No date: Medical history non-contributory   Reproductive/Obstetrics negative OB ROS                             Anesthesia Physical Anesthesia Plan  ASA: I  Anesthesia Plan: General and Regional   Post-op Pain Management: GA combined w/ Regional for post-op pain   Induction:   PONV Risk Score and Plan: 2 and Ondansetron and Dexamethasone  Airway Management Planned:   Additional Equipment:   Intra-op Plan:   Post-operative Plan:   Informed Consent: I have reviewed the patients History and Physical, chart, labs and discussed the procedure including the risks, benefits and alternatives for the proposed anesthesia with the patient or authorized representative who has indicated his/her understanding and acceptance.   Dental Advisory Given  Plan Discussed with: Anesthesiologist, CRNA and Surgeon  Anesthesia Plan Comments:         Anesthesia Quick Evaluation

## 2016-11-08 NOTE — Progress Notes (Signed)
Patient unable to move fingers, does not have Any sensation to his hands or arm.  Pink in color, Warm to touch.

## 2016-11-08 NOTE — H&P (Signed)
The patient has been re-examined, and the chart reviewed, and there have been no interval changes to the documented history and physical.    The risks, benefits, and alternatives have been discussed at length, and the patient is willing to proceed.   

## 2016-11-08 NOTE — Anesthesia Post-op Follow-up Note (Signed)
Anesthesia QCDR form completed.        

## 2016-11-08 NOTE — Anesthesia Procedure Notes (Signed)
Procedure Name: Intubation Date/Time: 11/08/2016 8:04 AM Performed by: Leander Rams Pre-anesthesia Checklist: Patient identified, Patient being monitored, Timeout performed, Emergency Drugs available and Suction available Patient Re-evaluated:Patient Re-evaluated prior to induction Oxygen Delivery Method: Circle system utilized Preoxygenation: Pre-oxygenation with 100% oxygen Induction Type: IV induction Ventilation: Mask ventilation without difficulty Laryngoscope Size: Mac and 4 Grade View: Grade I Tube type: Oral Tube size: 7.5 mm Number of attempts: 1 Airway Equipment and Method: Stylet Placement Confirmation: ETT inserted through vocal cords under direct vision,  positive ETCO2 and breath sounds checked- equal and bilateral Secured at: 21 cm Tube secured with: Tape Dental Injury: Teeth and Oropharynx as per pre-operative assessment

## 2016-11-08 NOTE — Anesthesia Procedure Notes (Signed)
Anesthesia Regional Block: Interscalene brachial plexus block   Pre-Anesthetic Checklist: ,, timeout performed, Correct Patient, Correct Site, Correct Laterality, Correct Procedure, Correct Position, site marked, Risks and benefits discussed,  Surgical consent,  Pre-op evaluation,  At surgeon's request and post-op pain management  Laterality: Left and Upper  Prep: chloraprep       Needles:  Injection technique: Single-shot  Needle Type: Stimiplex     Needle Length: 5cm  Needle Gauge: 22     Additional Needles:   Procedures:,,,, ultrasound used (permanent image in chart),,,,  Narrative:  Start time: 11/08/2016 7:30 AM End time: 11/08/2016 7:35 AM Injection made incrementally with aspirations every 5 mL.  Performed by: Personally  Anesthesiologist: Lenard SimmerKARENZ, Fremon Zacharia  Additional Notes: Functioning IV was confirmed and monitors were applied.  A 50mm 22ga Stimuplex needle was used. Sterile prep and drape,hand hygiene and sterile gloves were used.  Negative aspiration and negative test dose prior to incremental administration of local anesthetic. The patient tolerated the procedure well.

## 2016-11-08 NOTE — Transfer of Care (Signed)
Immediate Anesthesia Transfer of Care Note  Patient: James Mosley C Strehlow  Procedure(s) Performed: SHOULDER ARTHROSCOPY, SUBACROMINAL DECOMPRESSION, ROTATOR CUFF REPAIR, LYSIS OF ADHESION (Left Shoulder)  Patient Location: PACU  Anesthesia Type:General  Level of Consciousness: awake  Airway & Oxygen Therapy: Patient Spontanous Breathing  Post-op Assessment: Report given to RN  Post vital signs: stable  Last Vitals:  Vitals:   11/08/16 0743 11/08/16 0748  BP: (!) 145/86 (!) 145/88  Pulse: 75 68  Resp: 16 15  Temp:    SpO2: 100% 100%    Last Pain:  Vitals:   11/08/16 0610  TempSrc: Tympanic         Complications: No apparent anesthesia complications

## 2016-11-08 NOTE — Discharge Instructions (Signed)

## 2016-11-09 ENCOUNTER — Encounter: Payer: Self-pay | Admitting: Orthopedic Surgery
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
## 2019-12-08 ENCOUNTER — Ambulatory Visit (INDEPENDENT_AMBULATORY_CARE_PROVIDER_SITE_OTHER): Payer: BC Managed Care – PPO

## 2019-12-08 ENCOUNTER — Ambulatory Visit
Admission: EM | Admit: 2019-12-08 | Discharge: 2019-12-08 | Disposition: A | Discharge status: Home / Self Care | Payer: BC Managed Care – PPO

## 2019-12-08 ENCOUNTER — Other Ambulatory Visit: Payer: Self-pay

## 2019-12-08 ENCOUNTER — Encounter: Payer: Self-pay | Admitting: Emergency Medicine

## 2019-12-08 DIAGNOSIS — M778 Other enthesopathies, not elsewhere classified: Secondary | ICD-10-CM

## 2019-12-08 DIAGNOSIS — M79641 Pain in right hand: Secondary | ICD-10-CM | POA: Diagnosis not present

## 2019-12-08 DIAGNOSIS — M1811 Unilateral primary osteoarthritis of first carpometacarpal joint, right hand: Secondary | ICD-10-CM

## 2019-12-08 HISTORY — DX: Essential (primary) hypertension: I10

## 2019-12-08 HISTORY — DX: Hyperlipidemia, unspecified: E78.5

## 2019-12-08 MED ORDER — DICLOFENAC SODIUM 75 MG PO TBEC: 75.0000 mg | DELAYED_RELEASE_TABLET | Freq: Two times a day (BID) | ORAL | 0 refills | Status: AC

## 2019-12-08 NOTE — Discharge Instructions (Signed)
Your x-ray and exam are consistent with arthritis of your right thumb and radial aspect of your hand.  I suspect you are having a flareup of your underlying arthritis as well as tendinitis of the thumb and wrist.  We have given you a wrist brace to wear.  I have also sent an anti-inflammatory medication for you to take which should help with pain and inflammation.  You can also ice the area and take Tylenol to as needed for pain relief.  Keep your appointment with your PCP on December 7 since if you are not feeling better you may need a referral to orthopedics.

## 2019-12-08 NOTE — ED Provider Notes (Signed)
[facility]    CSN: [id number] Arrival date & time: 12/08/19  1108      History   Chief Complaint Chief Complaint  Patient presents with  . thumb pain    right    HPI James Mosley is a 54 y.o. male presenting for right thumb and wrist pain for the past month.  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  He says when he takes ibuprofen it has helped.  Denies any injury.  Numbness, tingling or weakness.  Denies any problems with range of motion, but says that he has a lot of increased pain whenever he flexes his thumb.  He states that he feels pain radiating up to the lower third of his forearm.  Patient denies any similar problem in the past.  He says that he did buy a soft wrist brace, but it does not have any thumb support and did not really seem to help.  Patient denies any other complaints or concerns at this time.  HPI  Past Medical History:  Diagnosis Date  . Hyperlipidemia   . Hypertension   . Medical history non-contributory     There are no problems to display for this patient.   Past Surgical History:  Procedure Laterality Date  . KNEE SURGERY Left   . SHOULDER ARTHROSCOPY WITH OPEN ROTATOR CUFF REPAIR AND DISTAL CLAVICLE ACROMINECTOMY Left 11/08/2016   Procedure: SHOULDER ARTHROSCOPY, SUBACROMINAL DECOMPRESSION, ROTATOR CUFF REPAIR, LYSIS OF ADHESION;  Surgeon: Juanell Fairly, MD;  Location: [facility] ORS;  Service: Orthopedics;  Laterality: Left;       Home Medications    Prior to Admission medications   Medication Sig Start Date End Date Taking? Authorizing Provider  amLODipine (NORVASC) 5 MG tablet Take 5 mg by mouth daily. 10/14/19  Yes [provider]  atorvastatin (LIPITOR) 40 MG tablet Take 40 mg by mouth daily. 06/26/18  Yes [provider]  diclofenac (VOLTAREN) 75 MG EC tablet Take 1 tablet (75 mg total) by mouth 2 (two) times daily. 12/08/19 01/07/20  Shirlee Latch, PA-C    Family History Family History  Problem Relation Age of Onset  . Hyperlipidemia Mother   . Heart disease Father     Social History Social History   Tobacco Use  . Smoking status: Never Smoker  . Smokeless tobacco: Never Used  Vaping Use  . Vaping Use: Never used  Substance Use Topics  . Alcohol use: Yes    Comment: OCC  . Drug use: No     Allergies   Ciprofloxacin and Sulfa antibiotics   Review of Systems Review of Systems  Musculoskeletal: Positive for arthralgias. Negative for joint swelling and myalgias.  Skin: Negative for color change, rash and wound.  Neurological: Negative for weakness and numbness.     Physical Exam Triage Vital Signs ED Triage Vitals  Enc Vitals Group     BP 12/08/19 1135 (!) 155/87     Pulse Rate 12/08/19 1135 79     Resp 12/08/19 1135 18     Temp 12/08/19 1135 97.8 F (36.6 C)     Temp Source 12/08/19 1135 Oral     SpO2 12/08/19 1135 100 %     Weight 12/08/19 1136 168 lb (76.2 kg)     Height 12/08/19 1136 5\' 5"  (1.651 m)     Head Circumference --      Peak Flow --  Pain Score 12/08/19 1135 7     Pain Loc --      Pain Edu? --      Excl. in GC? --    No data found.  Updated Vital Signs BP (!) 155/87 (BP Location: Left Arm)   Pulse 79   Temp 97.8 F (36.6 C) (Oral)   Resp 18   Ht 5\' 5"  (1.651 m)   Wt 168 lb (76.2 kg)   SpO2 100%   BMI 27.96 kg/m       Physical Exam Vitals and nursing note reviewed.  Constitutional:      General: He is not in acute distress.    Appearance: Normal appearance. He is well-developed. He is not ill-appearing.  HENT:     Head: Normocephalic and atraumatic.  Eyes:     General: No scleral icterus.    Conjunctiva/sclera: Conjunctivae normal.  Cardiovascular:     Rate and Rhythm: Normal rate.     Pulses: Normal pulses.  Pulmonary:     Effort: Pulmonary effort is normal. No respiratory distress.  Musculoskeletal:     Left wrist: Tenderness (radial wrist) present. No  snuff box tenderness. Normal range of motion.     Left hand: Tenderness (MCP) present. No swelling. Normal range of motion (painful movement of thumb). Normal sensation. Normal pulse.     Cervical back: Neck supple.  Skin:    General: Skin is warm and dry.  Neurological:     General: No focal deficit present.     Mental Status: He is alert. Mental status is at baseline.     Sensory: No sensory deficit.     Motor: No weakness.     Gait: Gait normal.  Psychiatric:        Mood and Affect: Mood normal.        Behavior: Behavior normal.        Thought Content: Thought content normal.      UC Treatments / Results  Labs (all labs ordered are listed, but only abnormal results are displayed) Labs Reviewed - No data to display  EKG   Radiology DG Hand Complete Right  Result Date: 12/08/2019 CLINICAL DATA:  Pain EXAM: RIGHT HAND - COMPLETE 3+ VIEW COMPARISON:  None. FINDINGS: Frontal, oblique, and lateral views were obtained. There is no fracture or dislocation. There is a tiny calcification in the lateral aspect of the third MCP joint, likely of arthropathic etiology. No appreciable joint space narrowing or erosion. Small cysts arising in the distal scaphoid. IMPRESSION: No fracture or dislocation. No appreciable joint space narrowing. Tiny calcification in the lateral aspect of the third MCP joint, likely of arthropathic etiology. Small cysts in the distal scaphoid, consistent with arthropathic etiology. Electronically Signed   By: 12/10/2019 III M.D.   On: 12/08/2019 13:00    Procedures Procedures (including critical care time)  Medications Ordered in UC Medications - No data to display  Initial Impression / Assessment and Plan / UC Course  I have reviewed the triage vital signs and the nursing notes.  Pertinent labs & imaging results that were available during my care of the patient were reviewed by me and considered in my medical decision making (see chart for  details).   X-ray of the right hand obtained.  X-rays show calcifications at the MCP joint where patient has some tenderness.  There are also small cysts near the scaphoid region.  He does not have any tenderness in this area.  He does have tenderness  of the radial aspect of the wrist.  Discussed results of x-ray with patient.  Advised him pain likely due to combination of arthritis flareup and tendinitis from overuse.  Advised supportive care with NSAIDs and RICE.  Patient given thumb spica brace.  Advised to follow-up with Ortho if not getting better as he may benefit from corticosteroid injection.  Advised to follow-up with [place] as needed.  Final Clinical Impressions(s) / UC Diagnoses   Final diagnoses:  Degenerative arthritis of thumb, right  Tendinitis of right wrist     Discharge Instructions     Your x-ray and exam are consistent with arthritis of your right thumb and radial aspect of your hand.  I suspect you are having a flareup of your underlying arthritis as well as tendinitis of the thumb and wrist.  We have given you a wrist brace to wear.  I have also sent an anti-inflammatory medication for you to take which should help with pain and inflammation.  You can also ice the area and take Tylenol to as needed for pain relief.  Keep your appointment with your PCP on December 7 since if you are not feeling better you may need a referral to orthopedics.   ED Prescriptions    Medication Sig Dispense Auth. Provider   diclofenac (VOLTAREN) 75 MG EC tablet Take 1 tablet (75 mg total) by mouth 2 (two) times daily. 60 tablet Gareth Morgan     PDMP not reviewed this encounter.   Shirlee Latch, PA-C 12/09/19 1419

## 2019-12-08 NOTE — ED Triage Notes (Addendum)
Patient in today c/o right thumb pain that radiates up his right arm x 1 month. No injury noted. Patient has taken OTC Ibuprofen. Patient states he did buy a brace for his hand, but it didn't help. He states the brace didn't stabilize the thumb.

## 2020-12-12 ENCOUNTER — Emergency Department
Admission: EM | Admit: 2020-12-12 | Discharge: 2020-12-12 | Disposition: A | Discharge status: Home / Self Care | Payer: BC Managed Care – PPO | Attending: Emergency Medicine | Admitting: Emergency Medicine

## 2020-12-12 ENCOUNTER — Emergency Department: Payer: BC Managed Care – PPO

## 2020-12-12 ENCOUNTER — Encounter: Payer: Self-pay | Admitting: Emergency Medicine

## 2020-12-12 ENCOUNTER — Other Ambulatory Visit: Payer: Self-pay

## 2020-12-12 DIAGNOSIS — I10 Essential (primary) hypertension: Secondary | ICD-10-CM | POA: Insufficient documentation

## 2020-12-12 DIAGNOSIS — Z79899 Other long term (current) drug therapy: Secondary | ICD-10-CM | POA: Insufficient documentation

## 2020-12-12 DIAGNOSIS — R079 Chest pain, unspecified: Secondary | ICD-10-CM | POA: Diagnosis present

## 2020-12-12 DIAGNOSIS — R0789 Other chest pain: Secondary | ICD-10-CM

## 2020-12-12 LAB — CBC
HCT: 37.4 % — ABNORMAL LOW (ref 39.0–52.0)
Hemoglobin: 12.1 g/dL — ABNORMAL LOW (ref 13.0–17.0)
MCH: 29 pg (ref 26.0–34.0)
MCHC: 32.4 g/dL (ref 30.0–36.0)
MCV: 89.7 fL (ref 80.0–100.0)
Platelets: 312 10*3/uL (ref 150–400)
RBC: 4.17 MIL/uL — ABNORMAL LOW (ref 4.22–5.81)
RDW: 14.2 % (ref 11.5–15.5)
WBC: 6.9 10*3/uL (ref 4.0–10.5)
nRBC: 0 % (ref 0.0–0.2)

## 2020-12-12 LAB — BASIC METABOLIC PANEL
Anion gap: 6 (ref 5–15)
BUN: 16 mg/dL (ref 6–20)
CO2: 26 mmol/L (ref 22–32)
Calcium: 8.9 mg/dL (ref 8.9–10.3)
Chloride: 104 mmol/L (ref 98–111)
Creatinine, Ser: 0.95 mg/dL (ref 0.61–1.24)
GFR, Estimated: >60 mL/min (ref >60)
Glucose, Bld: 115 mg/dL — ABNORMAL HIGH (ref 70–99)
Potassium: 3.5 mmol/L (ref 3.5–5.1)
Sodium: 136 mmol/L (ref 135–145)

## 2020-12-12 LAB — TROPONIN I (HIGH SENSITIVITY)
Troponin I (High Sensitivity): 7 ng/L (ref <18)
Troponin I (High Sensitivity): 7 ng/L (ref <18)

## 2020-12-12 MED ORDER — IOHEXOL 350 MG/ML SOLN
75.0000 mL | Freq: Once | INTRAVENOUS | Status: AC | PRN
  Administered 2020-12-12: 75 mL via INTRAVENOUS

## 2020-12-12 NOTE — ED Provider Notes (Signed)
[facility] Emergency Department Provider Note   ____________________________________________    I have reviewed the triage vital signs and the nursing notes.   HISTORY  Chief Complaint Chest Pain     HPI James Mosley is a 55 y.o. male with history of high blood pressure who presents with complaints of chest pain.  Patient reports over the last several days he has had intermittent episodes of left-sided chest pain which he describes as dull in intensity and does seem to come and go.  Not related to exertion.  Does not smoke.  Reports episode earlier today which was worse than typical which made him come to the emergency department  Past Medical History:  Diagnosis Date   Hyperlipidemia    Hypertension    Medical history non-contributory     There are no problems to display for this patient.   Past Surgical History:  Procedure Laterality Date   KNEE SURGERY Left    SHOULDER ARTHROSCOPY WITH OPEN ROTATOR CUFF REPAIR AND DISTAL CLAVICLE ACROMINECTOMY Left 11/08/2016   Procedure: SHOULDER ARTHROSCOPY, SUBACROMINAL DECOMPRESSION, ROTATOR CUFF REPAIR, LYSIS OF ADHESION;  Surgeon: Juanell Fairly, MD;  Location: [facility] ORS;  Service: Orthopedics;  Laterality: Left;    Prior to Admission medications   Medication Sig Start Date End Date Taking? Authorizing Provider  amLODipine (NORVASC) 5 MG tablet Take 5 mg by mouth daily. 10/14/19   [provider]  atorvastatin (LIPITOR) 40 MG tablet Take 40 mg by mouth daily. 06/26/18   [provider]     Allergies Ciprofloxacin and Sulfa antibiotics  Family History  Problem Relation Age of Onset   Hyperlipidemia Mother    Heart disease Father     Social History Social History   Tobacco Use   Smoking status: Never   Smokeless tobacco: Never  Vaping Use   Vaping Use: Never used  Substance Use Topics   Alcohol use: Yes    Comment: OCC   Drug use: No    Review of  Systems  Constitutional: No fever/chills Eyes: No visual changes.  ENT: No sore throat. Cardiovascular: As above, pain has resolved however Respiratory: Denies shortness of breath. Gastrointestinal: No abdominal pain.  No nausea, no vomiting.   Genitourinary: Negative for dysuria. Musculoskeletal: Negative for back pain. Skin: Negative for rash. Neurological: Negative for headaches or weakness   ____________________________________________   PHYSICAL EXAM:  VITAL SIGNS: ED Triage Vitals  Enc Vitals Group     BP 12/12/20 1431 (!) 163/80     Pulse Rate 12/12/20 1428 (!) 101     Resp 12/12/20 1428 20     Temp 12/12/20 1428 98.5 F (36.9 C)     Temp Source 12/12/20 1428 Oral     SpO2 12/12/20 1428 97 %     Weight 12/12/20 1429 76.2 kg (168 lb)     Height 12/12/20 1429 1.651 m (5\' 5" )     Head Circumference --      Peak Flow --      Pain Score 12/12/20 1429 6     Pain Loc --      Pain Edu? --      Excl. in GC? --     Constitutional: Alert and oriented. No acute distress. Pleasant and interactive Eyes: Conjunctivae are normal.  Head: Atraumatic. Nose: No congestion/rhinnorhea. Mouth/Throat: Mucous membranes are moist.   Neck:  Painless ROM Cardiovascular: Normal rate, regular rhythm. Grossly normal heart sounds.  Good peripheral circulation. Respiratory: Normal respiratory effort.  No retractions. Lungs CTAB. Gastrointestinal: Soft and nontender. No distention.  No CVA tenderness.  Musculoskeletal: No lower extremity tenderness nor edema.  Warm and well perfused Neurologic:  Normal speech and language. No gross focal neurologic deficits are appreciated.  Skin:  Skin is warm, dry and intact. No rash noted. Psychiatric: Mood and affect are normal. Speech and behavior are normal.  ____________________________________________   LABS (all labs ordered are listed, but only abnormal results are displayed)  Labs Reviewed  BASIC METABOLIC PANEL - Abnormal; Notable for the  following components:      Result Value   Glucose, Bld 115 (*)    All other components within normal limits  CBC - Abnormal; Notable for the following components:   RBC 4.17 (*)    Hemoglobin 12.1 (*)    HCT 37.4 (*)    All other components within normal limits  TROPONIN I (HIGH SENSITIVITY)  TROPONIN I (HIGH SENSITIVITY)   ____________________________________________  EKG  ED ECG REPORT I, Jene Every, the attending physician, personally viewed and interpreted this ECG.  Date: 12/12/2020  Rhythm: normal sinus rhythm QRS Axis: normal Intervals: normal ST/T Wave abnormalities: normal Narrative Interpretation: no evidence of acute ischemia  ____________________________________________  RADIOLOGY  Chest x-ray reviewed by me, no acute abnormality ____________________________________________   PROCEDURES  Procedure(s) performed: No  Procedures   Critical Care performed: No ____________________________________________   INITIAL IMPRESSION / ASSESSMENT AND PLAN / ED COURSE  Pertinent labs & imaging results that were available during my care of the patient were reviewed by me and considered in my medical decision making (see chart for details).   Patient is a Naval architect by trade, describes pain in his central chest that seem to radiate into his left chest and that "took his breath away ".  Symptoms have primarily resolved at this time.  No calf pain or swelling.  No history of heart disease no history of GERD.  EKG is reassuring, high sensitive troponin is normal.  Lab work otherwise reassuring  CT angiography is negative for PE  Second high-sensitivity troponin is normal, patient remains asymptomatic, appropriate for discharge with close follow-up with cardiology, return precautions discussed    ____________________________________________   FINAL CLINICAL IMPRESSION(S) / ED DIAGNOSES  Final diagnoses:  Atypical chest pain        Note:  This  document was prepared using Dragon voice recognition software and may include unintentional dictation errors.    Jene Every, MD 12/12/20 [name]

## 2020-12-12 NOTE — ED Triage Notes (Signed)
Pt reports for the last couple of days has had intermittent cp to his mid to left chest that is sharp in nature. Pt states the episodes last a few minutes but then come back. Pt denies SOB, nausea or other sx's with the pain. 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 Pt states today his episode of cp came on aggressively.

## 2022-02-14 IMAGING — CR DG HAND COMPLETE 3+V*R*
3 series · 3 of 3 positions shown · non-contrast
Comparison: None.

CLINICAL DATA: Pain

EXAM:
RIGHT HAND - COMPLETE 3+ VIEW

[hand ap]
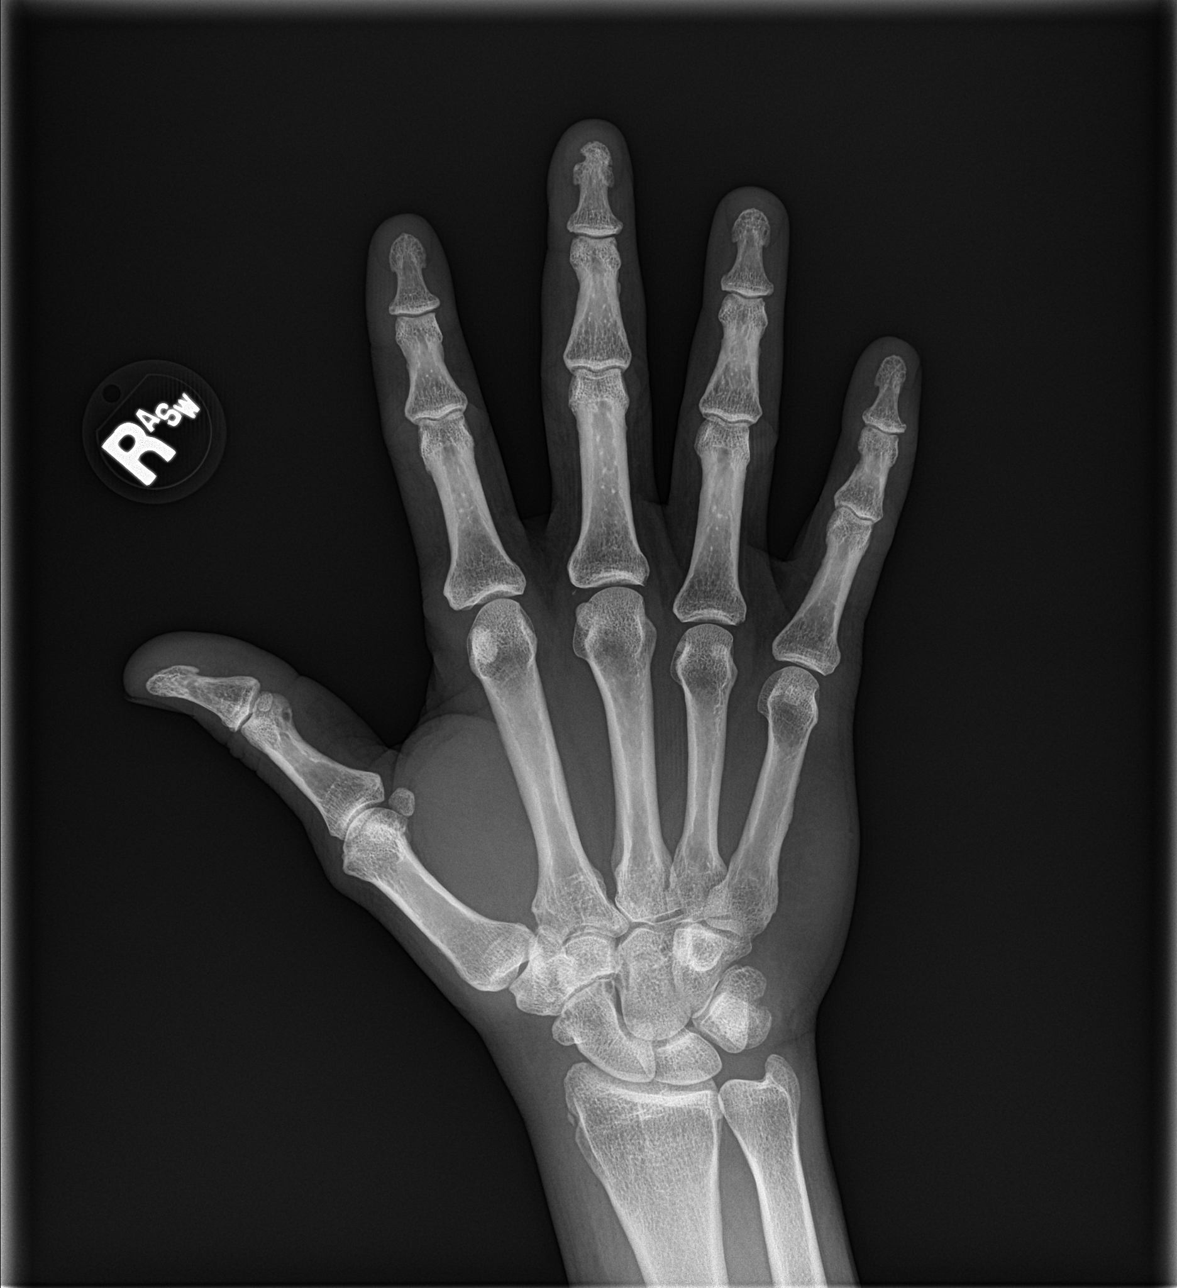

[hand obl]
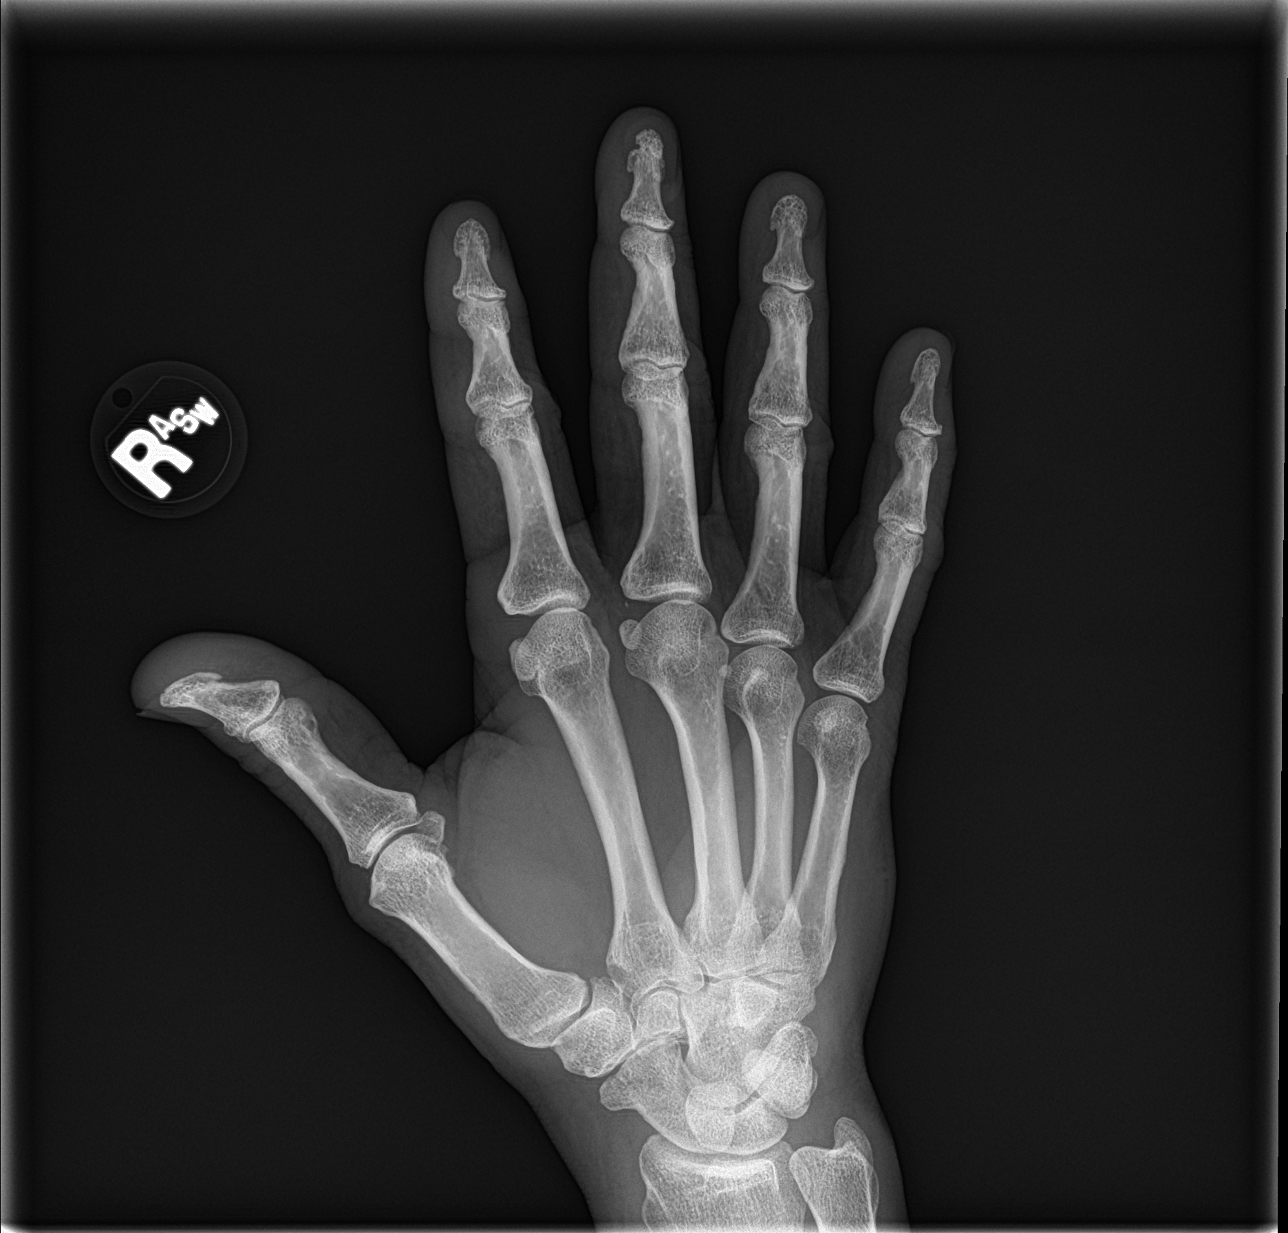

[hand lat]
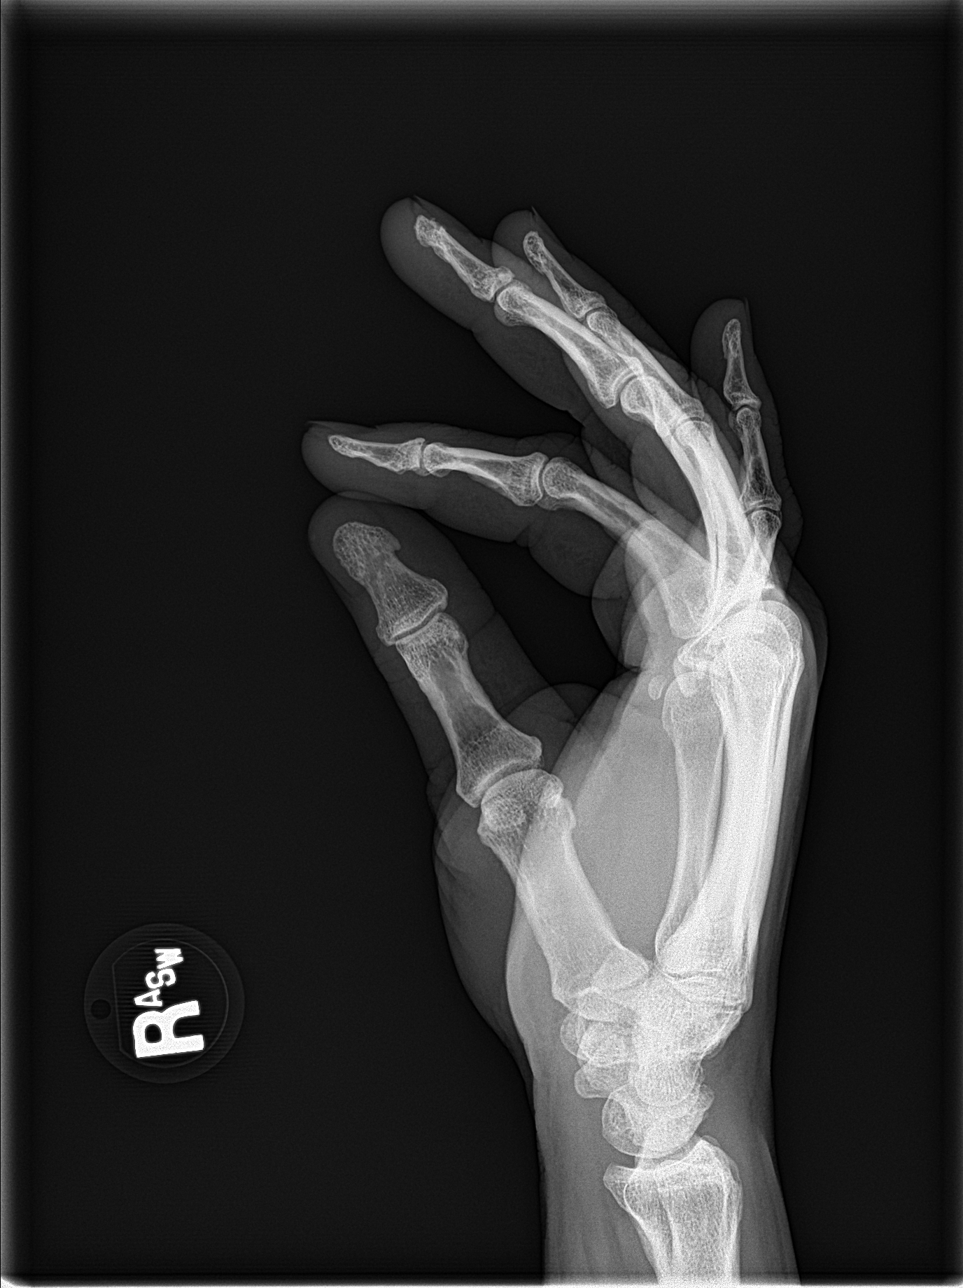

[3 of 3 positions shown; findings below may reference images not displayed]

FINDINGS: Frontal, oblique, and lateral views were obtained. There is no
fracture or dislocation. There is a tiny calcification in the
lateral aspect of the third MCP joint, likely of arthropathic
etiology. No appreciable joint space narrowing or erosion. Small
cysts arising in the distal scaphoid.
IMPRESSION: No fracture or dislocation. No appreciable joint space narrowing.
Tiny calcification in the lateral aspect of the third MCP joint,
likely of arthropathic etiology. Small cysts in the distal scaphoid,
consistent with arthropathic etiology.

## 2023-09-11 ENCOUNTER — Encounter: Payer: Self-pay | Admitting: Emergency Medicine

## 2023-09-11 ENCOUNTER — Ambulatory Visit: Admission: EM | Admit: 2023-09-11 | Discharge: 2023-09-11 | Attending: Family Medicine | Admitting: Family Medicine

## 2023-09-11 DIAGNOSIS — R55 Syncope and collapse: Secondary | ICD-10-CM

## 2023-09-11 DIAGNOSIS — R519 Headache, unspecified: Secondary | ICD-10-CM

## 2023-09-11 DIAGNOSIS — R42 Dizziness and giddiness: Secondary | ICD-10-CM

## 2023-09-11 DIAGNOSIS — I10 Essential (primary) hypertension: Secondary | ICD-10-CM

## 2023-09-11 LAB — GLUCOSE, CAPILLARY: Glucose-Capillary: 111 mg/dL — ABNORMAL HIGH (ref 70–99)

## 2023-09-11 NOTE — Discharge Instructions (Signed)

## 2023-09-11 NOTE — ED Provider Notes (Signed)
 [facility]    CSN: [id number] Arrival date & time: 09/11/23  1814      History   Chief Complaint Chief Complaint  Patient presents with   Loss of Consciousness    HPI James Mosley is a 58 y.o. male.   HPI   James Mosley presents after passing out twice this morning while getting ready to walk his dog around 1030 this morning. He hit his knee prior to the fall and the next thing he knew he passed out. Doesn't think he was out more than 2 minutes. He stood up and fell again. Again was out less than 2 minutes.  He scratched his head on the grass during his fall. No one witnessed the fall.   Now, he feels okay.  He didn't take his blood pressure medications regularly. On average about once a month but did  this morning after his loss of consciousness episodes. His finance doesn't see him take his medication. Has had headaches over the past 2-3 weeks.  We was walking to the trunk of his car and felt off.  Has been dizzy and lightheaded and says he was somewhat disoriented. Sitting still he felt better. He spoke with his family who urged him to go to the doctor.  Has been having pain on the right side of his head that radiates to his shoulder. He is not able to sleep at night due to his pain. Has known shoulder issues and may need surgery.       Past Medical History:  Diagnosis Date   Hyperlipidemia    Hypertension    Medical history non-contributory     There are no active problems to display for this patient.   Past Surgical History:  Procedure Laterality Date   KNEE SURGERY Left    SHOULDER ARTHROSCOPY WITH OPEN ROTATOR CUFF REPAIR AND DISTAL CLAVICLE ACROMINECTOMY Left 11/08/2016   Procedure: SHOULDER ARTHROSCOPY, SUBACROMINAL DECOMPRESSION, ROTATOR CUFF REPAIR, LYSIS OF ADHESION;  Surgeon: Marchia Drivers, MD;  Location: [facility] OR[facility];  Service: Orthopedics;  Laterality: Left;       Home Medications    Prior to Admission medications   Medication Sig Start  Date End Date Taking? Authorizing Provider  chlorthalidone (HYGROTON) 25 MG tablet Take 25 mg by mouth daily. 11/07/22 11/07/23 Yes [provider]  metFORMIN (GLUCOPHAGE-XR) 500 MG 24 hr tablet Take 500 mg by mouth. 11/07/22 11/07/23 Yes [provider]  amLODipine (NORVASC) 5 MG tablet Take 5 mg by mouth daily. 10/14/19   [provider]  atorvastatin (LIPITOR) 40 MG tablet Take 40 mg by mouth daily. 06/26/18   [provider]    Family History Family History  Problem Relation Age of Onset   Hyperlipidemia Mother    Heart disease Father     Social History Social History   Tobacco Use   Smoking status: Never   Smokeless tobacco: Never  Vaping Use   Vaping status: Never Used  Substance Use Topics   Alcohol use: Yes    Comment: OCC   Drug use: No     Allergies   Ciprofloxacin and Sulfa antibiotics   Review of Systems Review of Systems: negative unless otherwise stated in HPI.      Physical Exam Triage Vital Signs ED Triage Vitals  Encounter Vitals Group     BP      Girls Systolic BP Percentile      Girls Diastolic BP Percentile      Boys Systolic BP Percentile  Boys Diastolic BP Percentile      Pulse      Resp      Temp      Temp src      SpO2      Weight      Height      Head Circumference      Peak Flow      Pain Score      Pain Loc      Pain Education      Exclude from Growth Chart    Orthostatic VS for the past 24 hrs:  BP- Lying Pulse- Lying BP- Sitting Pulse- Sitting BP- Standing at 0 minutes Pulse- Standing at 0 minutes  09/11/23 1837 159/87 93 (!) 171/95 91 172/80 93    Updated Vital Signs BP (!) 175/78 (BP Location: Right Arm)   Pulse 97   Temp 98.7 F (37.1 C)   Resp 18   Wt 75.8 kg   SpO2 97%   BMI 27.79 kg/m   Visual Acuity Right Eye Distance:   Left Eye Distance:   Bilateral Distance:    Right Eye Near:   Left Eye Near:    Bilateral Near:     Physical Exam GEN:     alert, non-toxic  appearing male and no distress    HENT:  mucus membranes moist, oropharyngeal without lesions or erythema,  nares patent, no nasal discharge, uvula midline , no hematoma, no hemotympanum  EYES:   pupils equal and reactive, EOM intact NECK:  supple, normal ROM, no lymphadenopathy  RESP:  clear to auscultation bilaterally, no increased work of breathing  CVS:   regular rate and rhythm, distal pulses intact  EXT:    atraumatic, no LE edema  NEURO:  alert, oriented, speech normal, CN 2-12 grossly intact, no facial droop,  sensation grossly intact, strength 5/5 bilateral UE and LE, normal coordination Skin:   warm and dry, no forehead abrasions     UC Treatments / Results  Labs (all labs ordered are listed, but only abnormal results are displayed) Labs Reviewed  GLUCOSE, CAPILLARY - Abnormal; Notable for the following components:      Result Value   Glucose-Capillary 111 (*)    All other components within normal limits  CBG MONITORING, ED    EKG  If EKG performed, see my interpretation in the MDM section  Radiology No results found.   Procedures Procedures (including critical care time)  Medications Ordered in UC Medications - No data to display  Initial Impression / Assessment and Plan / UC Course  I have reviewed the triage vital signs and the nursing notes.  Pertinent labs & imaging results that were available during my care of the patient were reviewed by me and considered in my medical decision making (see chart for details).       Patient is a 58 y.o. male  who presents for syncope and collapse twice today.  Overall patient is nontoxic-appearing and afebrile.  He is hypertensive here at 175/78. Discussed risks of uncontrolled blood pressure with patient. He is non-adherent to his medication. I worry about possible CVA vs arrhythmia or ACS.    Patient follows with [facility].  He has history of hypertension, hyperlipidemia, diabetes.  He is prescribed  amlodipine 10 mg and chlorthalidone 25 mg for his high blood pressure.  At his October 2024 visit his blood pressure was normal.  Orthostatic vitals are unremarkable. Non-fasting blood sugar 111.  EKG personally interpreted by  me; NSR without acute ST or T wave changes; normal PR interval, no QT prolongation, compared to prior EKG from 12/12/20 LVH not present.   Given his syncope and collapse with headache, neck pain and dizziness, recommended neurological and cardiac evaluation as well as to lower of his BP safely in the ED. Pt's finance will drive him to [facility] ED for higher level of care.   Discussed MDM, treatment plan and plan for follow-up with patient who agrees with plan.     Final Clinical Impressions(s) / UC Diagnoses   Final diagnoses:  Syncope and collapse  Acute nonintractable headache, unspecified headache type  Dizziness  Elevated blood pressure reading with diagnosis of hypertension     Discharge Instructions      You have been advised to follow up immediately in the emergency department for concerning signs or symptoms as discussed during your visit. If you declined EMS transport, please have a family member take you directly to the ED at this time. Do not delay.   Based on concerns about condition, if you do not follow up in the ED, you may risk poor outcomes including worsening of condition, delayed treatment and potentially life threatening issues. If you have declined to go to the ED at this time, you should call your PCP immediately to set up a follow up appointment.   Go to ED for red flag symptoms, including; fevers you cannot reduce with Tylenol/Motrin , severe headaches, vision changes, numbness/weakness in part of the body, lethargy, confusion, intractable vomiting, severe dehydration, chest pain, breathing difficulty, severe persistent abdominal or pelvic pain, signs of severe infection (increased redness, swelling of an area), feeling faint or passing  out, dizziness, etc. You should especially go to the ED for sudden acute worsening of condition if you do not elect to go at this time.       ED Prescriptions   None    PDMP not reviewed this encounter.   Letti Towell, DO 09/11/23 1935

## 2023-09-11 NOTE — ED Notes (Signed)
 Patient is being discharged from the Urgent Care and sent to the Emergency Department via POV . Per   Kriste Berth, DO  , patient is in need of higher level of care due to syncope and collapse . Patient is aware and verbalizes understanding of plan of care.  Vitals:   09/11/23 1835  BP: (!) 175/78  Pulse: 97  Resp: 18  Temp: 98.7 F (37.1 C)  SpO2: 97%

## 2023-09-11 NOTE — ED Triage Notes (Signed)
 Pt states he was walking his dog today and had 2 syncope episodes. He states he fell in the grass and it was an unwitnessed episode. When he came to he was dizzy. He also has pain that starts at the right side of his head down to his shoulder for a few weeks.
